# Patient Record
Sex: Female | Born: 1955 | Hispanic: Yes | Marital: Married | Smoking: Never smoker
Health system: Southern US, Community
[De-identification: ages and names within clinical notes are randomized; demographics above are authoritative.]

## PROBLEM LIST (undated history)

## (undated) DIAGNOSIS — K760 Fatty (change of) liver, not elsewhere classified: Secondary | ICD-10-CM

## (undated) DIAGNOSIS — I10 Essential (primary) hypertension: Secondary | ICD-10-CM

---

## 2018-01-06 ENCOUNTER — Ambulatory Visit: Payer: Self-pay | Attending: Oncology

## 2019-07-06 ENCOUNTER — Emergency Department: Payer: Self-pay

## 2019-07-06 ENCOUNTER — Other Ambulatory Visit: Payer: Self-pay

## 2019-07-06 ENCOUNTER — Emergency Department
Admission: EM | Admit: 2019-07-06 | Discharge: 2019-07-06 | Disposition: A | Discharge status: Home / Self Care | Payer: Self-pay | Attending: Emergency Medicine | Admitting: Emergency Medicine

## 2019-07-06 DIAGNOSIS — R040 Epistaxis: Secondary | ICD-10-CM | POA: Insufficient documentation

## 2019-07-06 DIAGNOSIS — R05 Cough: Secondary | ICD-10-CM | POA: Insufficient documentation

## 2019-07-06 DIAGNOSIS — I10 Essential (primary) hypertension: Secondary | ICD-10-CM | POA: Insufficient documentation

## 2019-07-06 DIAGNOSIS — Z20828 Contact with and (suspected) exposure to other viral communicable diseases: Secondary | ICD-10-CM | POA: Insufficient documentation

## 2019-07-06 DIAGNOSIS — R51 Headache: Secondary | ICD-10-CM | POA: Insufficient documentation

## 2019-07-06 HISTORY — DX: Essential (primary) hypertension: I10

## 2019-07-06 HISTORY — DX: Fatty (change of) liver, not elsewhere classified: K76.0

## 2019-07-06 LAB — CBC WITH DIFFERENTIAL/PLATELET
Abs Immature Granulocytes: 0.04 10*3/uL (ref 0.00–0.07)
Basophils Absolute: 0.1 10*3/uL (ref 0.0–0.1)
Basophils Relative: 1 %
Eosinophils Absolute: 0.3 10*3/uL (ref 0.0–0.5)
Eosinophils Relative: 3 %
HCT: 43.4 % (ref 36.0–46.0)
Hemoglobin: 14 g/dL (ref 12.0–15.0)
Immature Granulocytes: 0 %
Lymphocytes Relative: 31 %
Lymphs Abs: 3.3 10*3/uL (ref 0.7–4.0)
MCH: 27.7 pg (ref 26.0–34.0)
MCHC: 32.3 g/dL (ref 30.0–36.0)
MCV: 85.8 fL (ref 80.0–100.0)
Monocytes Absolute: 0.7 10*3/uL (ref 0.1–1.0)
Monocytes Relative: 7 %
Neutro Abs: 6.2 10*3/uL (ref 1.7–7.7)
Neutrophils Relative %: 58 %
Platelets: 257 10*3/uL (ref 150–400)
RBC: 5.06 MIL/uL (ref 3.87–5.11)
RDW: 14.2 % (ref 11.5–15.5)
WBC: 10.6 10*3/uL — ABNORMAL HIGH (ref 4.0–10.5)
nRBC: 0 % (ref 0.0–0.2)

## 2019-07-06 LAB — COMPREHENSIVE METABOLIC PANEL
ALT: 33 U/L (ref 0–44)
AST: 31 U/L (ref 15–41)
Albumin: 4.4 g/dL (ref 3.5–5.0)
Alkaline Phosphatase: 125 U/L (ref 38–126)
Anion gap: 11 (ref 5–15)
BUN: 13 mg/dL (ref 8–23)
CO2: 24 mmol/L (ref 22–32)
Calcium: 9.4 mg/dL (ref 8.9–10.3)
Chloride: 106 mmol/L (ref 98–111)
Creatinine, Ser: 0.79 mg/dL (ref 0.44–1.00)
GFR calc Af Amer: >60 mL/min (ref >60)
GFR calc non Af Amer: >60 mL/min (ref >60)
Glucose, Bld: 104 mg/dL — ABNORMAL HIGH (ref 70–99)
Potassium: 3.5 mmol/L (ref 3.5–5.1)
Sodium: 141 mmol/L (ref 135–145)
Total Bilirubin: 0.3 mg/dL (ref 0.3–1.2)
Total Protein: 7.7 g/dL (ref 6.5–8.1)

## 2019-07-06 LAB — SARS CORONAVIRUS 2 BY RT PCR (HOSPITAL ORDER, PERFORMED IN ~~LOC~~ HOSPITAL LAB): SARS Coronavirus 2: NEGATIVE

## 2019-07-06 MED ORDER — OXYMETAZOLINE HCL 0.05 % NA SOLN
2.0000 | Freq: Once | NASAL | Status: AC
  Administered 2019-07-06: 18:00:00 2 via NASAL
  Filled 2019-07-06: qty 30

## 2019-07-06 NOTE — ED Triage Notes (Signed)
Headache, nosebleed, coughing up phlegm.

## 2019-07-06 NOTE — ED Notes (Signed)
Used interpreter # [phone] Pt reports nose bleed x4 since last pm - she is concerned that "is going to die from bleeding so much" - at this time no active bleeding and pt in NAD Pt reports non-productive cough x4d - denies fever, covid exposure, exposure to anyone with illness Dr Kerman Passey at bedside

## 2019-07-06 NOTE — ED Provider Notes (Signed)
[facility] Emergency Department Provider Note  Time seen: 6:14 PM  I have reviewed the triage vital signs and the nursing notes.   HISTORY  Chief Complaint Epistaxis   HPI Jessica Carrillo is a 63 y.o. female with a past medical history of hypertension presents to the emergency department for a nosebleed.  According to the patient  since yesterday she has been experiencing an intermittent nosebleed out of her right nostril.  States it was heavier today so she came into the emergency department.  Patient states a slight headache she also states she has been coughing with phlegm production over the past 4 days.  Denies any known fever.  Denies any prior nosebleeds.  Past Medical History:  Diagnosis Date  . Fatty liver   . Hypertension     There are no active problems to display for this patient.    Prior to Admission medications   Not on File    Not on File  No family history on file.  Social History Social History   Tobacco Use  . Smoking status: Never Smoker  Substance Use Topics  . Alcohol use: Never    Frequency: Never  . Drug use: Never    Review of Systems Constitutional: Negative for fever. ENT: Positive for nosebleed intermittent since yesterday. Cardiovascular: Negative for chest pain. Respiratory: Negative for shortness of breath. Gastrointestinal: Negative for abdominal pain Musculoskeletal: Negative for musculoskeletal complaints Neurological: Mild headache All other ROS negative  ____________________________________________   PHYSICAL EXAM:  VITAL SIGNS: ED Triage Vitals  Enc Vitals Group     BP 07/06/19 1727 (!) 162/87     Pulse Rate 07/06/19 1727 93     Resp 07/06/19 1727 18     Temp 07/06/19 1727 98 F (36.7 C)     Temp Source 07/06/19 1727 Oral     SpO2 07/06/19 1727 95 %     Weight 07/06/19 1729 150 lb (68 kg)     Height 07/06/19 1729 5\' 4"  (1.626 m)     Head Circumference --      Peak Flow --       Pain Score 07/06/19 1728 5     Pain Loc --      Pain Edu? --      Excl. in GC? --    Constitutional: Alert and oriented. Well appearing and in no distress. Eyes: Normal exam ENT      Head: Normocephalic and atraumatic.      Mouth/Throat: Mucous membranes are moist. Cardiovascular: Normal rate, regular rhythm. No murmurs, rubs, or gallops. Respiratory: Normal respiratory effort without tachypnea nor retractions. Breath sounds are clear Gastrointestinal: Soft and nontender. No distention.  Musculoskeletal: Nontender with normal range of motion in all extremities.  Neurologic:  Normal speech and language. No gross focal neurologic deficits  Skin:  Skin is warm, dry and intact.  Psychiatric: Mood and affect are normal.   ____________________________________________    INITIAL IMPRESSION / ASSESSMENT AND PLAN / ED COURSE  Pertinent labs & imaging results that were available during my care of the patient were reviewed by me and considered in my medical decision making (see chart for details).   Patient presents to the emergency department for intermittent nosebleed to her right nostril since yesterday.  Currently patient is hemostatic, no dried blood or clot in the nostrils, no septal hematoma, no obvious bleeding.  We will sprayed with Doxy metolazone as a precaution, check labs and continue to closely monitor.  Patient's CBC is largely within normal limits with a normal H&H.  We will swab for coronavirus given 4 days of cough with sputum production.  Patient's COVID test is negative.  Lab work is normal.  Patient remains hemostatic we will discharge home.  Jessica Carrillo was evaluated in Emergency Department on 07/06/2019 for the symptoms described in the history of present illness. She was evaluated in the context of the global COVID-19 pandemic, which necessitated consideration that the patient might be at risk for infection with the SARS-CoV-2 virus that causes  COVID-19. Institutional protocols and algorithms that pertain to the evaluation of patients at risk for COVID-19 are in a state of rapid change based on information released by regulatory bodies including the CDC and federal and state organizations. These policies and algorithms were followed during the patient's care in the ED.  ____________________________________________   FINAL CLINICAL IMPRESSION(S) / ED DIAGNOSES  Epistaxis   Harvest Dark, MD 07/06/19 2015

## 2019-07-06 NOTE — ED Triage Notes (Signed)
Pt says the headache and nose bleed since yesterday, difficulty concentrating, headache, difficulty sleeping. Vision changes.

## 2019-11-15 NOTE — Progress Notes (Signed)
Due to Covid 19 pandemic, a televisit was used to enroll patient into our [facility] program and obtain her health history via [place], the interpreter.  Verbal consent given.  2 identifiers used to verify patient.  Patient denies any breast problems.  Last pap in 2019 at [facility].  Next pap due in 2022 unless otherwise indicated. Patient is to report directly to the [facility] tomorrow for her mammogram.  Will follow up per [facility]CP protocol.

## 2019-11-16 ENCOUNTER — Ambulatory Visit: Payer: Self-pay | Attending: Oncology | Admitting: *Deleted

## 2019-11-16 ENCOUNTER — Other Ambulatory Visit: Payer: Self-pay

## 2019-11-16 ENCOUNTER — Encounter: Payer: Self-pay | Admitting: *Deleted

## 2019-11-16 ENCOUNTER — Ambulatory Visit
Admission: RE | Admit: 2019-11-16 | Discharge: 2019-11-16 | Disposition: A | Discharge status: Home / Self Care | Payer: Self-pay | Source: Ambulatory Visit | Attending: Oncology | Admitting: Oncology

## 2019-11-16 DIAGNOSIS — Z0000 Encounter for general adult medical examination without abnormal findings: Secondary | ICD-10-CM | POA: Insufficient documentation

## 2019-11-23 ENCOUNTER — Inpatient Hospital Stay
Admission: RE | Admit: 2019-11-23 | Discharge: 2019-11-23 | Disposition: A | Discharge status: Home / Self Care | Payer: Self-pay | Source: Ambulatory Visit | Attending: *Deleted | Admitting: *Deleted

## 2019-11-23 ENCOUNTER — Other Ambulatory Visit: Payer: Self-pay | Admitting: *Deleted

## 2019-11-23 DIAGNOSIS — Z1231 Encounter for screening mammogram for malignant neoplasm of breast: Secondary | ICD-10-CM

## 2019-11-24 ENCOUNTER — Encounter: Payer: Self-pay | Admitting: *Deleted

## 2019-11-24 NOTE — Progress Notes (Signed)
Letter mailed from the [facility] to inform patient of her normal mammogram results.  Patient is to follow-up with annual screening in one year. 

## 2021-01-01 ENCOUNTER — Other Ambulatory Visit: Payer: Self-pay | Admitting: Obstetrics and Gynecology

## 2021-01-01 DIAGNOSIS — Z1231 Encounter for screening mammogram for malignant neoplasm of breast: Secondary | ICD-10-CM

## 2021-01-18 ENCOUNTER — Other Ambulatory Visit: Payer: Self-pay

## 2021-01-18 ENCOUNTER — Ambulatory Visit
Admission: RE | Admit: 2021-01-18 | Discharge: 2021-01-18 | Disposition: A | Discharge status: Home / Self Care | Payer: Medicare HMO | Source: Ambulatory Visit | Attending: Obstetrics and Gynecology | Admitting: Obstetrics and Gynecology

## 2021-01-18 DIAGNOSIS — Z1231 Encounter for screening mammogram for malignant neoplasm of breast: Secondary | ICD-10-CM | POA: Insufficient documentation

## 2021-01-30 ENCOUNTER — Encounter: Payer: Self-pay | Admitting: Obstetrics and Gynecology

## 2021-12-27 ENCOUNTER — Other Ambulatory Visit: Payer: Self-pay | Admitting: Obstetrics and Gynecology

## 2021-12-27 DIAGNOSIS — Z1231 Encounter for screening mammogram for malignant neoplasm of breast: Secondary | ICD-10-CM

## 2022-02-03 ENCOUNTER — Ambulatory Visit: Payer: Medicare PPO | Attending: Obstetrics and Gynecology

## 2022-06-25 ENCOUNTER — Other Ambulatory Visit: Payer: Self-pay | Admitting: Family Medicine

## 2022-06-25 DIAGNOSIS — N644 Mastodynia: Secondary | ICD-10-CM

## 2022-07-15 ENCOUNTER — Ambulatory Visit
Admission: RE | Admit: 2022-07-15 | Discharge: 2022-07-15 | Disposition: A | Discharge status: Home / Self Care | Payer: Medicare Other | Source: Ambulatory Visit | Attending: Family Medicine | Admitting: Family Medicine

## 2022-07-15 DIAGNOSIS — N644 Mastodynia: Secondary | ICD-10-CM

## 2022-08-03 IMAGING — MG MM DIGITAL SCREENING BILAT W/ TOMO AND CAD
6 of 10 series · 6 of 30 positions shown · non-contrast
Comparison: Previous exam(s).

CLINICAL DATA: Screening.

EXAM:
DIGITAL SCREENING BILATERAL MAMMOGRAM WITH TOMOSYNTHESIS AND CAD
TECHNIQUE: Bilateral screening digital craniocaudal and mediolateral oblique
mammograms were obtained. Bilateral screening digital breast
tomosynthesis was performed. The images were evaluated with
computer-aided detection.

[L CC synth-2D]
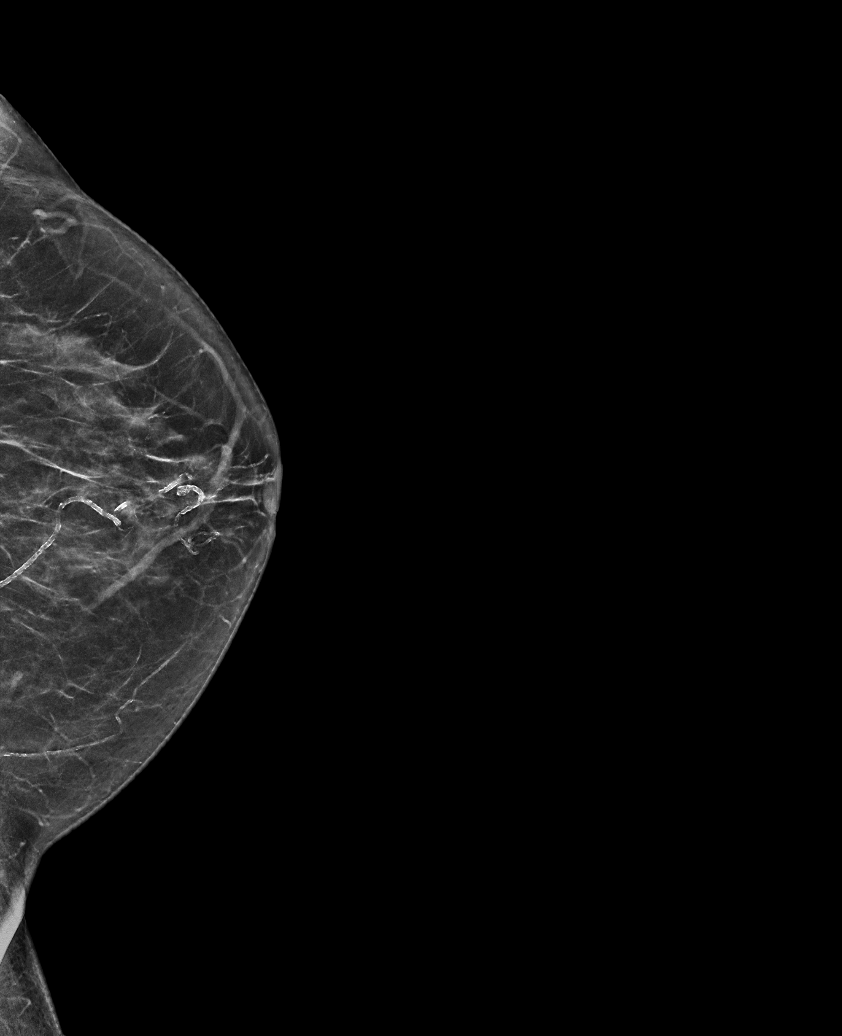

[L MLO synth-2D (1 of 2)]
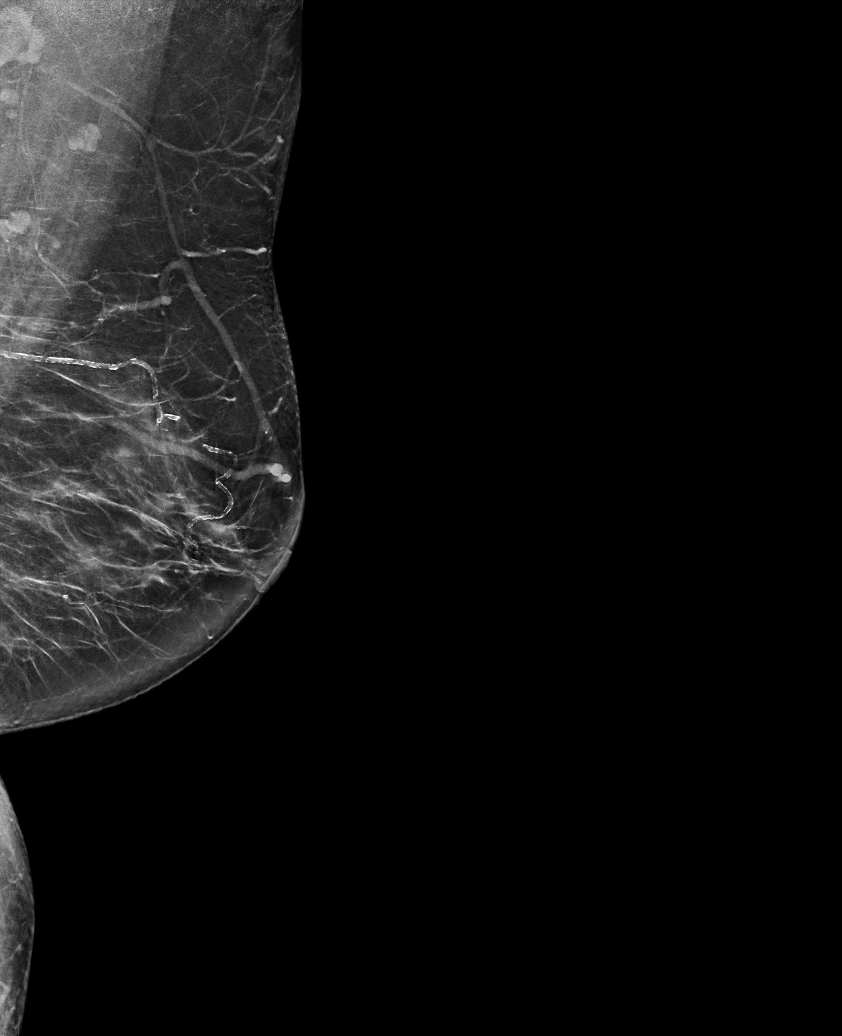

[R CC synth-2D]
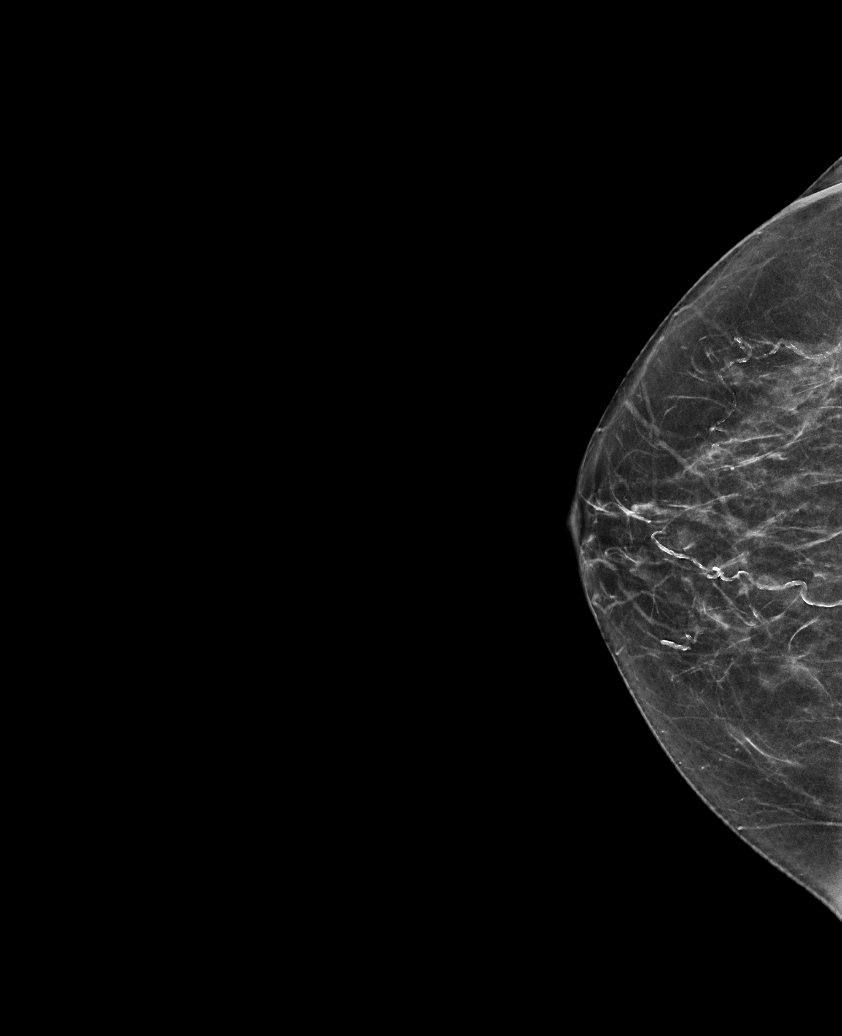

[R MLO synth-2D]
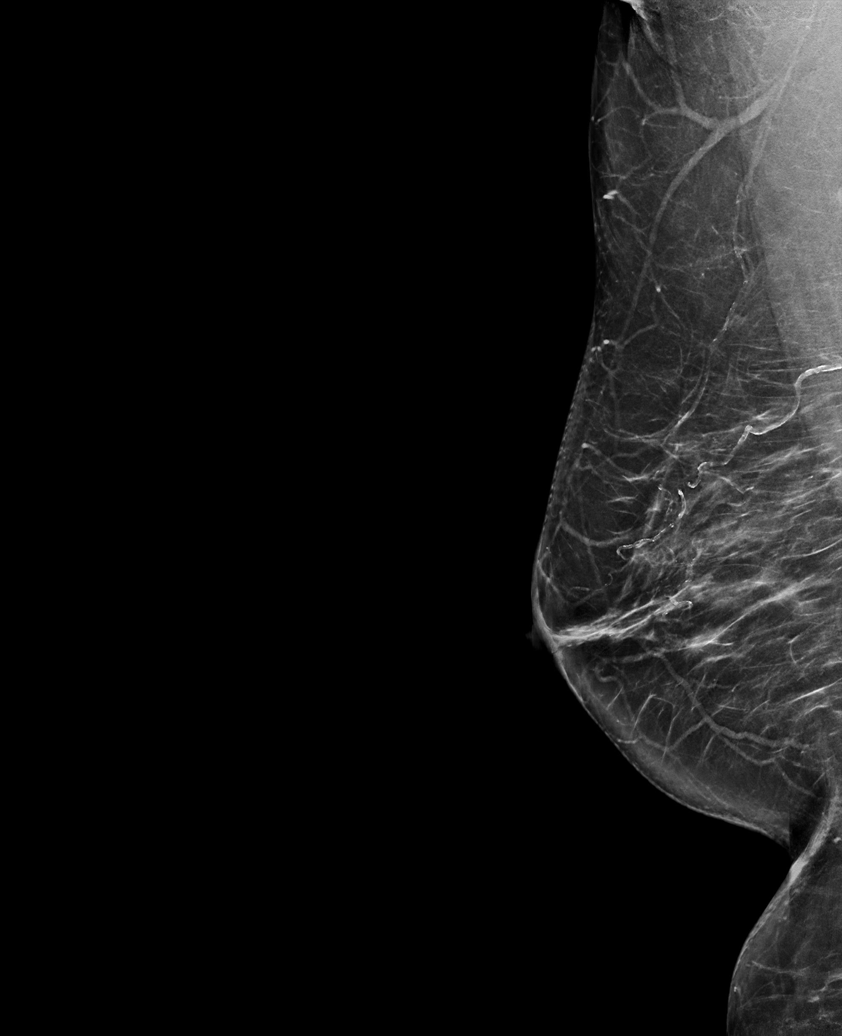

[L MLO synth-2D (2 of 2)]
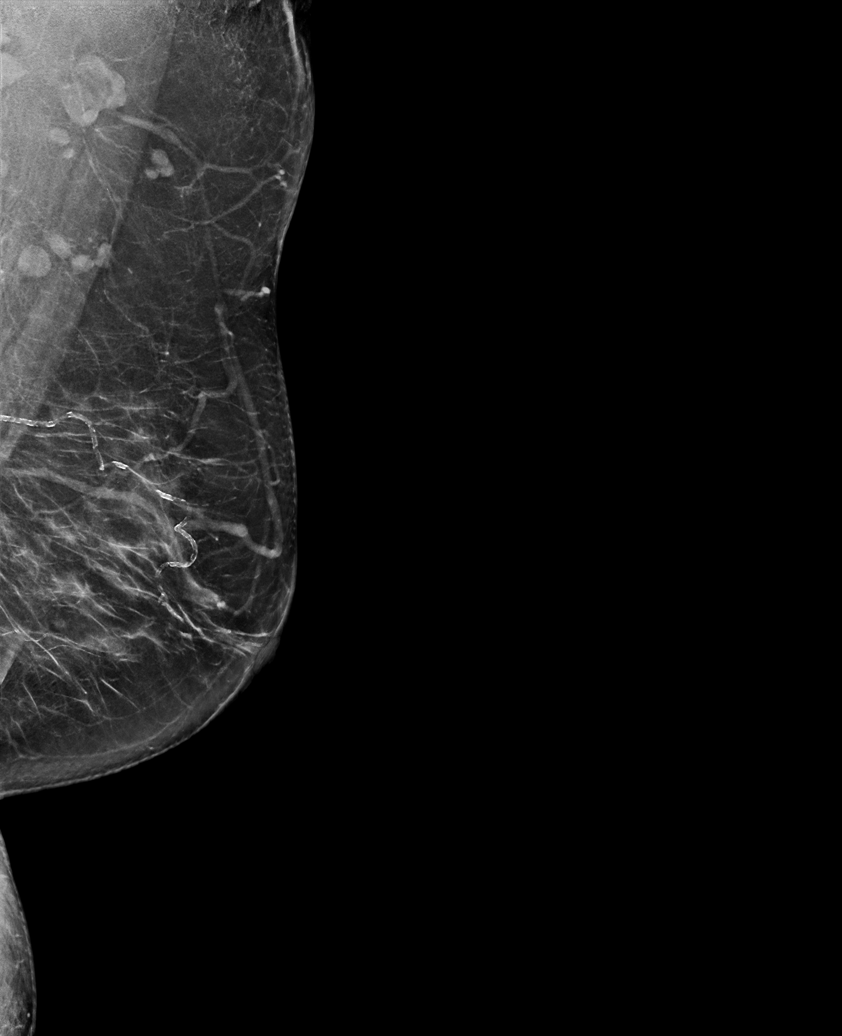

[L MLO tomo · tomo slice 35/69.0]
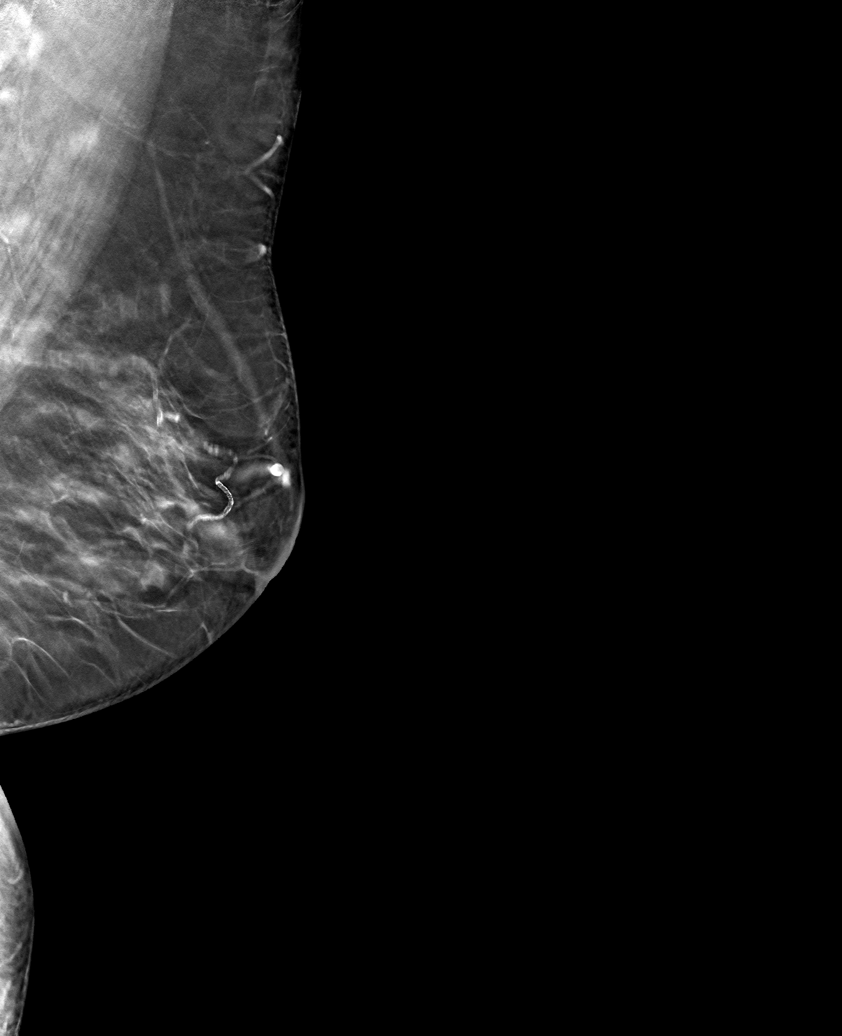

[6 of 30 positions shown; findings below may reference images not displayed]

ACR Breast Density Category b: There are scattered areas of
fibroglandular density.
FINDINGS: There are no findings suspicious for malignancy. The images were
evaluated with computer-aided detection.
IMPRESSION: No mammographic evidence of malignancy. A result letter of this
screening mammogram will be mailed directly to the patient.

RECOMMENDATION:
Screening mammogram in one year. (Code:[id number])

BI-RADS CATEGORY  1: Negative.

## 2022-09-16 ENCOUNTER — Other Ambulatory Visit: Payer: Self-pay | Admitting: Obstetrics and Gynecology

## 2022-09-16 DIAGNOSIS — R0981 Nasal congestion: Secondary | ICD-10-CM

## 2023-08-03 ENCOUNTER — Other Ambulatory Visit: Payer: Self-pay | Admitting: Obstetrics and Gynecology

## 2023-08-03 DIAGNOSIS — Z1231 Encounter for screening mammogram for malignant neoplasm of breast: Secondary | ICD-10-CM

## 2023-08-13 ENCOUNTER — Other Ambulatory Visit: Payer: Self-pay | Admitting: Obstetrics and Gynecology

## 2023-08-13 DIAGNOSIS — R079 Chest pain, unspecified: Secondary | ICD-10-CM

## 2023-08-21 ENCOUNTER — Encounter
Admission: RE | Admit: 2023-08-21 | Discharge: 2023-08-21 | Disposition: A | Discharge status: Home / Self Care | Payer: 59 | Source: Ambulatory Visit | Attending: Obstetrics and Gynecology | Admitting: Obstetrics and Gynecology

## 2023-08-21 DIAGNOSIS — R079 Chest pain, unspecified: Secondary | ICD-10-CM | POA: Insufficient documentation

## 2023-08-21 LAB — NM MYOCAR MULTI W/SPECT W/WALL MOTION / EF
LV dias vol: 41 mL (ref 46–106)
LV sys vol: 14 mL
MPHR: 153 {beats}/min
Nuc Stress EF: 66 %
Peak HR: 95 {beats}/min
Percent HR: 62 %
Rest HR: 67 {beats}/min
Rest Nuclear Isotope Dose: 10.3 mCi
SDS: 0
SRS: 2
SSS: 0
ST Depression (mm): 0 mm
Stress Nuclear Isotope Dose: 30.5 mCi
TID: 1

## 2023-08-21 MED ORDER — TECHNETIUM TC 99M TETROFOSMIN IV KIT
10.0000 | PACK | Freq: Once | INTRAVENOUS | Status: AC | PRN
  Administered 2023-08-21: 10.26 via INTRAVENOUS

## 2023-08-21 MED ORDER — REGADENOSON 0.4 MG/5ML IV SOLN
0.4000 mg | Freq: Once | INTRAVENOUS | Status: AC
  Administered 2023-08-21: 0.4 mg via INTRAVENOUS
  Filled 2023-08-21: qty 5

## 2023-08-21 MED ORDER — TECHNETIUM TC 99M TETROFOSMIN IV KIT
30.5000 | PACK | Freq: Once | INTRAVENOUS | Status: AC | PRN
  Administered 2023-08-21: 30.5 via INTRAVENOUS
